# Patient Record
Sex: Male | Born: 1995 | Race: White | Hispanic: No | Marital: Married | Smoking: Current some day smoker
Health system: Southern US, Community
[De-identification: ages and names within clinical notes are randomized; demographics above are authoritative.]

## PROBLEM LIST (undated history)

## (undated) DIAGNOSIS — E669 Obesity, unspecified: Secondary | ICD-10-CM

## (undated) HISTORY — PX: HERNIA REPAIR: SHX51

---
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
## 2017-04-30 ENCOUNTER — Encounter: Payer: No Typology Code available for payment source | Admitting: Physician Assistant

## 2018-06-29 ENCOUNTER — Encounter: Payer: Self-pay | Admitting: Emergency Medicine

## 2018-06-29 ENCOUNTER — Ambulatory Visit: Payer: Self-pay | Admitting: Emergency Medicine

## 2018-06-29 VITALS — BP 116/70 | HR 95 | Temp 98.8°F | Resp 14

## 2018-06-29 DIAGNOSIS — B356 Tinea cruris: Secondary | ICD-10-CM

## 2018-06-29 DIAGNOSIS — R21 Rash and other nonspecific skin eruption: Secondary | ICD-10-CM

## 2018-06-29 LAB — POCT URINALYSIS DIPSTICK
Bilirubin, UA: NEGATIVE
Blood, UA: NEGATIVE
Glucose, UA: NEGATIVE
Ketones, UA: NEGATIVE
Leukocytes, UA: NEGATIVE
Nitrite, UA: NEGATIVE
Protein, UA: NEGATIVE
Spec Grav, UA: 1.02 (ref 1.010–1.025)
Urobilinogen, UA: 0.2 E.U./dL
pH, UA: 5.5 (ref 5.0–8.0)

## 2018-06-29 NOTE — Progress Notes (Signed)
Subjective: 5 days of worsening pruritic red rash on the skin of testicles and groin.  No swelling or pain in GU area.  The rash may have started after sweating a lot 5 days ago.   Patient started using OTC topical Lotrimin cream yesterday, and seems to help somewhat. He denies any other GU symptoms.  No dysuria or hematuria or urinary frequency or urethral discharge.  No high risk sexual behavior.  Denies fever or chills or nausea or vomiting.  Review of systems otherwise negative.  No fever or chills.  Objective: Pleasant male no distress.  Alert, cooperative. BP 116/70, pulse 92, temp 98.8, respirations 14, O2 saturation 98% on room air Lungs: Equal expansion Heart regular rate and rhythm Abdomen: Nondistended GU: Other than skin rash, no scrotal swelling or lesions.  Penis appears normal otherwise. Skin: Moist, diffusely reddened macular rash skin of scrotum, perineum, and inguinal area.  No other lesions.  No satellite lesions.  No pustules or vesicles. He consented to and requested UA today which is within normal limits.  Assessment: Tinea cruris  Plans:Treatment options discussed, as well as risks, benefits, alternatives. Patient voiced understanding and agreement with the following plans: Discussed keeping the area clean and dry. As he just started OTC Lotrimin yesterday, advised to continue applying Lotrimin cream twice daily for 7 to 14 days if needed.-He requested the option of written prescription for a "stronger" oral antifungal, and after risk benefits alternatives discussed, written prescription for Lamisil 250 mg.  #10, no refills.  1 p.o. daily for 7 to 10 days, if the Lotrimin topically not seeming to help.  Follow-up with your primary care doctor or dermatologist in 5-7 days if not improving, or sooner if symptoms become worse. Precautions discussed. Red flags discussed. Questions invited and answered. Patient voiced understanding and agreement.

## 2020-05-13 ENCOUNTER — Emergency Department: Payer: No Typology Code available for payment source

## 2020-05-13 ENCOUNTER — Emergency Department
Admission: EM | Admit: 2020-05-13 | Discharge: 2020-05-14 | Disposition: A | Discharge status: Home / Self Care | Payer: No Typology Code available for payment source | Attending: Emergency Medicine | Admitting: Emergency Medicine

## 2020-05-13 DIAGNOSIS — R220 Localized swelling, mass and lump, head: Secondary | ICD-10-CM | POA: Diagnosis present

## 2020-05-13 DIAGNOSIS — S60221A Contusion of right hand, initial encounter: Secondary | ICD-10-CM | POA: Diagnosis not present

## 2020-05-13 DIAGNOSIS — Y9389 Activity, other specified: Secondary | ICD-10-CM | POA: Diagnosis not present

## 2020-05-13 DIAGNOSIS — S022XXA Fracture of nasal bones, initial encounter for closed fracture: Secondary | ICD-10-CM | POA: Diagnosis not present

## 2020-05-13 DIAGNOSIS — Y999 Unspecified external cause status: Secondary | ICD-10-CM | POA: Insufficient documentation

## 2020-05-13 DIAGNOSIS — Y92149 Unspecified place in prison as the place of occurrence of the external cause: Secondary | ICD-10-CM | POA: Diagnosis not present

## 2020-05-13 DIAGNOSIS — F17200 Nicotine dependence, unspecified, uncomplicated: Secondary | ICD-10-CM | POA: Insufficient documentation

## 2020-05-13 MED ORDER — IBUPROFEN 600 MG PO TABS
600.0000 mg | ORAL_TABLET | Freq: Once | ORAL | Status: AC
  Administered 2020-05-13: 600 mg via ORAL
  Filled 2020-05-13: qty 1
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
## 2020-05-14 NOTE — Discharge Instructions (Signed)
As we discussed, your workup was notable for one or more nasal bone fractures.  These injuries are typically managed nonoperatively.  Please follow up with the physician listed or one of his/her colleagues in about a week (call at the next available opportunity to set up the appointment).  In the meantime, please use over-the-counter ibuprofen according to label instructions.  Do not blow your nose.  If you need to sneeze, try to keep your mouth open to decrease the pressure.  Return to the emergency department if you develop new or worsening symptoms that concern you.

## 2021-05-27 ENCOUNTER — Encounter: Payer: Self-pay | Admitting: Physician Assistant

## 2021-05-27 ENCOUNTER — Ambulatory Visit: Payer: Managed Care, Other (non HMO) | Admitting: Physician Assistant

## 2021-05-27 VITALS — BP 136/78 | HR 80 | Temp 98.1°F | Resp 16 | Ht 67.0 in | Wt 220.0 lb

## 2021-05-27 DIAGNOSIS — Z008 Encounter for other general examination: Secondary | ICD-10-CM | POA: Diagnosis not present

## 2021-05-27 DIAGNOSIS — Z0000 Encounter for general adult medical examination without abnormal findings: Secondary | ICD-10-CM
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
## 2021-05-27 NOTE — Patient Instructions (Signed)
Will report labs as available --please see MyChart report in a few days. Enjoyed meeting you !  Best wishes .Marland Kitchen Jan Fireman PA-C

## 2021-05-28 LAB — LIPID PANEL
Chol/HDL Ratio: 3.5 ratio (ref 0.0–5.0)
Cholesterol, Total: 186 mg/dL (ref 100–199)
HDL: 53 mg/dL (ref >39)
LDL Chol Calc (NIH): 105 mg/dL — ABNORMAL HIGH (ref 0–99)
Triglycerides: 158 mg/dL — ABNORMAL HIGH (ref 0–149)
VLDL Cholesterol Cal: 28 mg/dL (ref 5–40)

## 2021-05-28 LAB — GLUCOSE, RANDOM: Glucose: 80 mg/dL (ref 65–99)

## 2021-07-17 ENCOUNTER — Other Ambulatory Visit: Payer: Self-pay

## 2021-07-17 ENCOUNTER — Ambulatory Visit
Admission: RE | Admit: 2021-07-17 | Discharge: 2021-07-17 | Disposition: A | Discharge status: Home / Self Care | Payer: Managed Care, Other (non HMO) | Source: Ambulatory Visit | Attending: Emergency Medicine | Admitting: Emergency Medicine

## 2021-07-17 VITALS — BP 133/83 | HR 85 | Temp 98.4°F | Resp 18

## 2021-07-17 DIAGNOSIS — S61217A Laceration without foreign body of left little finger without damage to nail, initial encounter: Secondary | ICD-10-CM | POA: Diagnosis not present

## 2021-07-17 DIAGNOSIS — Z23 Encounter for immunization: Secondary | ICD-10-CM | POA: Diagnosis not present

## 2021-07-17 MED ORDER — TETANUS-DIPHTH-ACELL PERTUSSIS 5-2.5-18.5 LF-MCG/0.5 IM SUSY
0.5000 mL | PREFILLED_SYRINGE | Freq: Once | INTRAMUSCULAR | Status: AC
  Administered 2021-07-17: 0.5 mL via INTRAMUSCULAR

## 2021-07-17 MED ORDER — IBUPROFEN 600 MG PO TABS: 600.0000 mg | ORAL_TABLET | Freq: Four times a day (QID) | ORAL | 0 refills | Status: DC | PRN

## 2021-07-17 NOTE — ED Provider Notes (Signed)
HPI  SUBJECTIVE:  Ross Smith is a right-handed 25 y.o. male who presents with a laceration to his distal left little finger while cutting some raw chicken with a clean kitchen knife last night at 2330.  He states that he irrigated out extensively with soap and water, used alcohol to clean it.  He also tried liquid skin.  Came here because he is concerned he may have stitches.  He reports pain present only with use.  No erythema, edema, fevers.  No past medical history.  Tetanus unknown.  PMD: None.  History reviewed. No pertinent past medical history.  Past Surgical History:  Procedure Laterality Date   [place]    History reviewed. No pertinent family history.  Social History   Tobacco Use   Smoking status: Some Days    Types: Pipe, Cigars   Smokeless tobacco: Never   Tobacco comments:    Recently gave up viping- congratulated/ Now uses a few cigarettes and 1-2 cigars on days off-- works modified shifts with 2-3 days on /then similar off         Techniques to quit reviewed- encouraged- personal decision proceed  Vaping Use   Vaping Use: Never used  Substance Use Topics   Alcohol use: Yes    Alcohol/week: 12.0 standard drinks    Types: 6 Cans of beer, 6 Shots of liquor per week   Drug use: No    No current facility-administered medications for this encounter.  Current Outpatient Medications:    ibuprofen (ADVIL) 600 MG tablet, Take 1 tablet (600 mg total) by mouth every 6 (six) hours as needed., Disp: 30 tablet, Rfl: 0  No Known Allergies   ROS  As noted in HPI.   Physical Exam  BP 133/83 (BP Location: Left Arm)   Pulse 85   Temp 98.4 F (36.9 C) (Oral)   Resp 18   SpO2 98%   Constitutional: Well developed, well nourished, no acute distress Eyes:  EOMI, conjunctiva normal bilaterally HENT: Normocephalic, atraumatic,mucus membranes moist Respiratory: Normal inspiratory effort Cardiovascular: Normal rate GI: nondistended skin: No rash, skin  intact Musculoskeletal: 0.5 cm U-shaped laceration distal tip left little finger.  Wound explored with adequate hemostasis, with no obvious foreign body.  Two-point discrimination intact distally and over the flap.  No other evidence of injury to the hand.   Neurologic: Alert & oriented x 3, no focal neuro deficits Psychiatric: Speech and behavior appropriate   ED Course   Medications  Tdap (BOOSTRIX) injection 0.5 mL (0.5 mLs Intramuscular Given 07/17/21 1726)    No orders of the defined types were placed in this encounter.   No results found for this or any previous visit (from the past 24 hour(s)). No results found.  ED Clinical Impression  1. Laceration of left little finger without foreign body without damage to nail, initial encounter      ED Assessment/Plan  Laceration is deep enough to require sutures, however, there is no evidence that it extends down to the bone.  It is within the 18-hour window of repair.  Patient irrigated it out copiously last night.   had patient wash it out again with chlorhexidine soap and tap water.  Procedure note: Cleaned finger with alcohol.  Used 1.5 cc of 1% plain lidocaine for digital block with adequate anesthesia.  Placed 2 interrupted 5-0 Prolene sutures with close approximation of wound edges.  Bandage placed.  Tetanus updated.  Patient tolerated procedure well.  Do not think that  we need to send home with prophylactic antibiotics as he irrigated out extensively last night and again here.  There are no signs of infection.  Home with Tylenol and ibuprofen.  Return here in 10 days for suture removal, sooner for any signs of infection  Discussed  MDM, treatment plan, and plan for follow-up with patient. Discussed sn/sx that should prompt return to the ED. patient agrees with plan.   Meds ordered this encounter  Medications   Tdap (BOOSTRIX) injection 0.5 mL   ibuprofen (ADVIL) 600 MG tablet    Sig: Take 1 tablet (600 mg total) by mouth  every 6 (six) hours as needed.    Dispense:  30 tablet    Refill:  0      *This clinic note was created using Lobbyist. Therefore, there may be occasional mistakes despite careful proofreading.  ?    Melynda Ripple, MD 07/20/21 [phone]

## 2021-07-17 NOTE — Discharge Instructions (Addendum)
Keep this clean and dry for the next 3 to 4 days at least.  We have updated your tetanus today.  May take 600 mg of ibuprofen combined with 1000 mg of Tylenol together 3-4 times a day as needed for pain.  Return here in 10 days and we will take out the suture, return sooner for any signs of infection.

## 2021-07-17 NOTE — ED Triage Notes (Signed)
Pt presents for cut to left pinky while cooking last night.

## 2022-01-09 ENCOUNTER — Ambulatory Visit
Admission: RE | Admit: 2022-01-09 | Discharge: 2022-01-09 | Disposition: A | Discharge status: Home / Self Care | Payer: Managed Care, Other (non HMO) | Source: Ambulatory Visit | Attending: Emergency Medicine | Admitting: Emergency Medicine

## 2022-01-09 ENCOUNTER — Other Ambulatory Visit: Payer: Self-pay

## 2022-01-09 VITALS — BP 125/94 | HR 102 | Temp 98.4°F | Resp 19 | Ht 68.0 in | Wt 224.2 lb

## 2022-01-09 DIAGNOSIS — J069 Acute upper respiratory infection, unspecified: Secondary | ICD-10-CM | POA: Diagnosis present

## 2022-01-09 DIAGNOSIS — Z20822 Contact with and (suspected) exposure to covid-19: Secondary | ICD-10-CM | POA: Diagnosis present

## 2022-01-09 HISTORY — DX: Obesity, unspecified: E66.9

## 2022-01-09 LAB — RESP PANEL BY RT-PCR (FLU A&B, COVID) ARPGX2
Influenza A by PCR: NEGATIVE
Influenza B by PCR: NEGATIVE
SARS Coronavirus 2 by RT PCR: NEGATIVE

## 2022-01-09 LAB — GROUP A STREP BY PCR: Group A Strep by PCR: NOT DETECTED

## 2022-01-09 MED ORDER — FLUTICASONE PROPIONATE 50 MCG/ACT NA SUSP: 2.0000 | Freq: Every day | NASAL | 0 refills | Status: DC

## 2022-01-09 MED ORDER — IBUPROFEN 600 MG PO TABS: 600.0000 mg | ORAL_TABLET | Freq: Four times a day (QID) | ORAL | 0 refills | Status: DC | PRN

## 2022-01-09 NOTE — ED Triage Notes (Signed)
Pt is here with a cough and sore throat since Monday, pt has taken OTC meds to relieve discomfort. ?

## 2022-01-09 NOTE — Discharge Instructions (Addendum)
We will contact you if and only if your labs are positive.  I will call in the appropriate medication based on your lab results.  Please answer phone calls from unknown phone numbers tonight.  You also get the results in my chart.  1 gram of Tylenol and 600 mg ibuprofen together 3-4 times a day as needed for pain.  Make sure you drink plenty of extra fluids.  Some people find salt water gargles and  Traditional Medicinal's "Throat Coat" tea helpful. Take 5 mL of liquid Benadryl and 5 mL of Maalox. Mix it together, and then hold it in your mouth for as long as you can and then swallow. You may do this 4 times a day.  Continue DayQuil, Echinacea, multivitamin.  Start Flonase, saline nasal irrigation with a NeilMed sinus rinse and distilled water as often as you want.  This will help prevent a bacterial sinus infection. ? ?Follow-up with a primary care provider of your choice.  See list below. ? ?Go to www.goodrx.com  or www.costplusdrugs.com to look up your medications. This will give you a list of where you can find your prescriptions at the most affordable prices. Or ask the pharmacist what the cash price is, or if they have any other discount programs available to help make your medication more affordable. This can be less expensive than what you would pay with insurance.   ? ?Here is a list of primary care providers who are taking new patients: ? ?[facility] [place] ?Dr. Rosette Reveal (sports medicine) ?Dr. Otilio Miu ?[address] ?[place] [address] ?[phone] ? ?[facility] ?[address], [place] [address] ?[phone] ? ?[facility] [place] ?[place] [address]  ?[phone] ? ?[facility] ?[place], [place] [address] ?[phone] ? ?[facility] ?[place]  ?[phone] ?[place], [place] [address] ? ?Here are clinics/ other resources who will see you if you do not have insurance. Some have certain  criteria that you must meet. Call them and find out what they are: ? ?[facility]: ?[address], [place], [place] [address] ?Phone: [phone] ?Hours: First and Third Saturdays of each Month, 9 a.m. - 1 p.m. ? ?[facility]: ?[address], [place], [place], [place] [address] ?Phone: [phone] ?Hours: ?Tuesday, 4 p.m. - 8 p.m. ?Thursday, 1 p.m. - 8 p.m. ?Wednesday, 9 a.m. - Noon ? ?[facility] ?[address] [place], [place] [address] ?Phone: [phone] ?Pharmacy Phone Number: [phone] ?Dental Phone Number: [phone] ?[employer] Insurance Help: [phone] ? ?Dental Hours: ?Monday - Thursday, 8 a.m. - 6 p.m. ? ?[place] ?[address] [place], [place] [address] ?Phone: [phone] ?Pharmacy Phone Number: [phone] ?[employer] Insurance Help: [phone] ? ?[facility] ?[address] [place], [place] [address] ?Phone: [phone] ?Pharmacy Phone Number: [phone] ?[employer] Insurance Help: [phone] ? ?[facility] ?[place],  [address] ?Phone: [phone] ?[employer] Help: [phone]  ? ?[address] [place],  [address] ?Phone: [phone] ?  ?

## 2022-01-09 NOTE — ED Provider Notes (Signed)
HPI ? ?SUBJECTIVE: ? ?Ross Smith is a 26 y.o. male who presents with 4 days of sore throat, fatigue, dry cough, body aches, nasal congestion, sinus pressure, postnasal drip.  He reports 1 episode of emesis, but is tolerating p.o. today.  No fevers, headaches, rhinorrhea, loss of sense of smell or taste, wheezing, shortness of breath, dyspnea on exertion, nausea, diarrhea, abdominal pain, rash.  No drooling, trismus, voice changes, neck stiffness, sensation of throat swelling shut, difficulty breathing or swallowing.  No allergy or GERD symptoms.  No known COVID, flu, strep, mono exposure.  He did not get the COVID or flu vaccines.  No antibiotics in the past month.  No antipyretic in the past 6 hours.  He has tried DayQuil, multivitamins, Echinacea with improvement in his symptoms.  No aggravating factors.  He is able to sleep at night without waking up coughing.  He has a past medical history of BMI above 30.  PCP: None. ? ?Past Medical History:  ?Diagnosis Date  ? Obese   ? ? ?Past Surgical History:  ?Procedure Laterality Date  ? HERNIA REPAIR  1998  ? ? ?Family History  ?Problem Relation Age of Onset  ? Healthy Mother   ? Healthy Father   ? ? ?Social History  ? ?Tobacco Use  ? Smoking status: Some Days  ?  Types: Pipe, Cigars  ? Smokeless tobacco: Never  ? Tobacco comments:  ?  Recently gave up viping- congratulated/ Now uses a few cigarettes and 1-2 cigars on days off-- works modified shifts with 2-3 days on /then similar off         Techniques to quit reviewed- encouraged- personal decision proceed  ?Vaping Use  ? Vaping Use: Never used  ?Substance Use Topics  ? Alcohol use: Yes  ?  Alcohol/week: 12.0 standard drinks  ?  Types: 6 Cans of beer, 6 Shots of liquor per week  ? Drug use: No  ? ? ?No current facility-administered medications for this encounter. ? ?Current Outpatient Medications:  ?  fluticasone (FLONASE) 50 MCG/ACT nasal spray, Place 2 sprays into both nostrils daily., Disp: 16 g, Rfl: 0 ?   ibuprofen (ADVIL) 600 MG tablet, Take 1 tablet (600 mg total) by mouth every 6 (six) hours as needed., Disp: 30 tablet, Rfl: 0 ? ?No Known Allergies ? ? ?ROS ? ?As noted in HPI.  ? ?Physical Exam ? ?BP (!) 125/94 (BP Location: Right Arm)   Pulse (!) 102   Temp 98.4 ?F (36.9 ?C) (Oral)   Resp 19   Ht '5\' 8"'$  (1.727 m)   Wt 101.7 kg   SpO2 96%   BMI 34.09 kg/m?  ? ?Constitutional: Well developed, well nourished, no acute distress ?Eyes:  EOMI, conjunctiva normal bilaterally ?HENT: Normocephalic, atraumatic,mucus membranes moist.  Positive nasal congestion.  Normal turbinates.  No maxillary, frontal sinus tenderness.  Beefy red oropharynx.  Tonsils normal size without exudates.  Uvula midline.  Positive postnasal drip. ?Neck: Positive anterior, posterior cervical lymphadenopathy. ?Respiratory: Normal inspiratory effort, lungs clear bilaterally.  Anterior, lateral chest ?Cardiovascular: Borderline regular tachycardia, no murmurs ?GI: nondistended, soft, nontender, no appreciable splenomegaly ?skin: No rash, skin intact ?Musculoskeletal: no deformities ?Neurologic: Alert & oriented x 3, no focal neuro deficits ?Psychiatric: Speech and behavior appropriate ? ? ?ED Course ? ? ?Medications - No data to display ? ?Orders Placed This Encounter  ?Procedures  ? Group A Strep by PCR  ?  Standing Status:   Standing  ?  Number of Occurrences:  1  ?  Order Specific Question:   Patient immune status  ?  Answer:   Normal  ?  Order Specific Question:   Release to patient  ?  Answer:   Immediate  ? Resp Panel by RT-PCR (Flu A&B, Covid) Nasopharyngeal Swab  ?  Standing Status:   Standing  ?  Number of Occurrences:   1  ? Airborne and Contact precautions  ?  Standing Status:   Standing  ?  Number of Occurrences:   1  ? ? ?Results for orders placed or performed during the hospital encounter of 01/09/22 (from the past 24 hour(s))  ?Resp Panel by RT-PCR (Flu A&B, Covid) Nasopharyngeal Swab     Status: None  ? Collection Time: 01/09/22   6:49 PM  ? Specimen: Nasopharyngeal Swab; Nasopharyngeal(NP) swabs in vial transport medium  ?Result Value Ref Range  ? SARS Coronavirus 2 by RT PCR NEGATIVE NEGATIVE  ? Influenza A by PCR NEGATIVE NEGATIVE  ? Influenza B by PCR NEGATIVE NEGATIVE  ? ?No results found. ? ?ED Clinical Impression ? ?1. Upper respiratory tract infection, unspecified type   ?2. Encounter for laboratory testing for COVID-19 virus   ?  ? ?ED Assessment/Plan ? ? ?Strep, COVID, flu PCR negative.  Patient with a URI.  Home with ibuprofen 600 mg, saline nasal irrigation, Flonase, Benadryl/Maalox mixture.  He will continue DayQuil, Echinacea, multivitamin.  Work note for Friday and Saturday off.  ER return precautions given.  Will provide primary care list and order assistance in finding a PCP. ? ?Discussed labs. MDM, treatment plan, and plan for follow-up with patient. Discussed sn/sx that should prompt return to the ED. patient agrees with plan.  ? ?Meds ordered this encounter  ?Medications  ? fluticasone (FLONASE) 50 MCG/ACT nasal spray  ?  Sig: Place 2 sprays into both nostrils daily.  ?  Dispense:  16 g  ?  Refill:  0  ? ibuprofen (ADVIL) 600 MG tablet  ?  Sig: Take 1 tablet (600 mg total) by mouth every 6 (six) hours as needed.  ?  Dispense:  30 tablet  ?  Refill:  0  ? ? ? ? ?*This clinic note was created using Lobbyist. Therefore, there may be occasional mistakes despite careful proofreading. ? ?? ? ?  ?Melynda Ripple, MD ?01/09/22 1935 ? ?
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
## 2022-12-29 ENCOUNTER — Encounter: Payer: Self-pay | Admitting: Podiatry

## 2022-12-29 ENCOUNTER — Ambulatory Visit (INDEPENDENT_AMBULATORY_CARE_PROVIDER_SITE_OTHER): Payer: Managed Care, Other (non HMO)

## 2022-12-29 ENCOUNTER — Ambulatory Visit: Payer: Managed Care, Other (non HMO) | Admitting: Podiatry

## 2022-12-29 DIAGNOSIS — M7750 Other enthesopathy of unspecified foot: Secondary | ICD-10-CM

## 2022-12-29 DIAGNOSIS — M722 Plantar fascial fibromatosis: Secondary | ICD-10-CM | POA: Diagnosis not present

## 2022-12-29 DIAGNOSIS — M7752 Other enthesopathy of left foot: Secondary | ICD-10-CM

## 2022-12-29 DIAGNOSIS — M62462 Contracture of muscle, left lower leg: Secondary | ICD-10-CM | POA: Diagnosis not present

## 2022-12-29 DIAGNOSIS — M62461 Contracture of muscle, right lower leg: Secondary | ICD-10-CM | POA: Diagnosis not present

## 2022-12-29 MED ORDER — MELOXICAM 15 MG PO TABS: 15.0000 mg | ORAL_TABLET | Freq: Every day | ORAL | 3 refills | Status: DC

## 2022-12-29 NOTE — Patient Instructions (Signed)
If you are not about 50% better in 1 month please let me know and I will send you to a physical therapist to work on this as well    Plantar Fasciitis (Heel Spur Syndrome) with Rehab The plantar fascia is a fibrous, ligament-like, soft-tissue structure that spans the bottom of the foot. Plantar fasciitis is a condition that causes pain in the foot due to inflammation of the tissue. SYMPTOMS  Pain and tenderness on the underneath side of the foot. Pain that worsens with standing or walking. CAUSES  Plantar fasciitis is caused by irritation and injury to the plantar fascia on the underneath side of the foot. Common mechanisms of injury include: Direct trauma to bottom of the foot. Damage to a small nerve that runs under the foot where the main fascia attaches to the heel bone. Stress placed on the plantar fascia due to bone spurs. RISK INCREASES WITH:  Activities that place stress on the plantar fascia (running, jumping, pivoting, or cutting). Poor strength and flexibility. Improperly fitted shoes. Tight calf muscles. Flat feet. Failure to warm-up properly before activity. Obesity. PREVENTION Warm up and stretch properly before activity. Allow for adequate recovery between workouts. Maintain physical fitness: Strength, flexibility, and endurance. Cardiovascular fitness. Maintain a health body weight. Avoid stress on the plantar fascia. Wear properly fitted shoes, including arch supports for individuals who have flat feet.  PROGNOSIS  If treated properly, then the symptoms of plantar fasciitis usually resolve without surgery. However, occasionally surgery is necessary.  RELATED COMPLICATIONS  Recurrent symptoms that may result in a chronic condition. Problems of the lower back that are caused by compensating for the injury, such as limping. Pain or weakness of the foot during push-off following surgery. Chronic inflammation, scarring, and partial or complete fascia tear, occurring  more often from repeated injections.  TREATMENT  Treatment initially involves the use of ice and medication to help reduce pain and inflammation. The use of strengthening and stretching exercises may help reduce pain with activity, especially stretches of the Achilles tendon. These exercises may be performed at home or with a therapist. Your caregiver may recommend that you use heel cups of arch supports to help reduce stress on the plantar fascia. Occasionally, corticosteroid injections are given to reduce inflammation. If symptoms persist for greater than 6 months despite non-surgical (conservative), then surgery may be recommended.   MEDICATION  If pain medication is necessary, then nonsteroidal anti-inflammatory medications, such as aspirin and ibuprofen, or other minor pain relievers, such as acetaminophen, are often recommended. Do not take pain medication within 7 days before surgery. Prescription pain relievers may be given if deemed necessary by your caregiver. Use only as directed and only as much as you need. Corticosteroid injections may be given by your caregiver. These injections should be reserved for the most serious cases, because they may only be given a certain number of times.  HEAT AND COLD Cold treatment (icing) relieves pain and reduces inflammation. Cold treatment should be applied for 10 to 15 minutes every 2 to 3 hours for inflammation and pain and immediately after any activity that aggravates your symptoms. Use ice packs or massage the area with a piece of ice (ice massage). Heat treatment may be used prior to performing the stretching and strengthening activities prescribed by your caregiver, physical therapist, or athletic trainer. Use a heat pack or soak the injury in warm water.  SEEK IMMEDIATE MEDICAL CARE IF: Treatment seems to offer no benefit, or the condition worsens. Any medications  produce adverse side effects.  EXERCISES- RANGE OF MOTION (ROM) AND  STRETCHING EXERCISES - Plantar Fasciitis (Heel Spur Syndrome) These exercises may help you when beginning to rehabilitate your injury. Your symptoms may resolve with or without further involvement from your physician, physical therapist or athletic trainer. While completing these exercises, remember:  Restoring tissue flexibility helps normal motion to return to the joints. This allows healthier, less painful movement and activity. An effective stretch should be held for at least 30 seconds. A stretch should never be painful. You should only feel a gentle lengthening or release in the stretched tissue.  RANGE OF MOTION - Toe Extension, Flexion Sit with your right / left leg crossed over your opposite knee. Grasp your toes and gently pull them back toward the top of your foot. You should feel a stretch on the bottom of your toes and/or foot. Hold this stretch for 10 seconds. Now, gently pull your toes toward the bottom of your foot. You should feel a stretch on the top of your toes and or foot. Hold this stretch for 10 seconds. Repeat  times. Complete this stretch 3 times per day.   RANGE OF MOTION - Ankle Dorsiflexion, Active Assisted Remove shoes and sit on a chair that is preferably not on a carpeted surface. Place right / left foot under knee. Extend your opposite leg for support. Keeping your heel down, slide your right / left foot back toward the chair until you feel a stretch at your ankle or calf. If you do not feel a stretch, slide your bottom forward to the edge of the chair, while still keeping your heel down. Hold this stretch for 10 seconds. Repeat 3 times. Complete this stretch 2 times per day.   STRETCH  Gastroc, Standing Place hands on wall. Extend right / left leg, keeping the front knee somewhat bent. Slightly point your toes inward on your back foot. Keeping your right / left heel on the floor and your knee straight, shift your weight toward the wall, not allowing your back  to arch. You should feel a gentle stretch in the right / left calf. Hold this position for 10 seconds. Repeat 3 times. Complete this stretch 2 times per day.  STRETCH  Soleus, Standing Place hands on wall. Extend right / left leg, keeping the other knee somewhat bent. Slightly point your toes inward on your back foot. Keep your right / left heel on the floor, bend your back knee, and slightly shift your weight over the back leg so that you feel a gentle stretch deep in your back calf. Hold this position for 10 seconds. Repeat 3 times. Complete this stretch 2 times per day.  STRETCH  Gastrocsoleus, Standing  Note: This exercise can place a lot of stress on your foot and ankle. Please complete this exercise only if specifically instructed by your caregiver.  Place the ball of your right / left foot on a step, keeping your other foot firmly on the same step. Hold on to the wall or a rail for balance. Slowly lift your other foot, allowing your body weight to press your heel down over the edge of the step. You should feel a stretch in your right / left calf. Hold this position for 10 seconds. Repeat this exercise with a slight bend in your right / left knee. Repeat 3 times. Complete this stretch 2 times per day.   STRENGTHENING EXERCISES - Plantar Fasciitis (Heel Spur Syndrome)  These exercises may help you  when beginning to rehabilitate your injury. They may resolve your symptoms with or without further involvement from your physician, physical therapist or athletic trainer. While completing these exercises, remember:  Muscles can gain both the endurance and the strength needed for everyday activities through controlled exercises. Complete these exercises as instructed by your physician, physical therapist or athletic trainer. Progress the resistance and repetitions only as guided.  STRENGTH - Towel Curls Sit in a chair positioned on a non-carpeted surface. Place your foot on a towel,  keeping your heel on the floor. Pull the towel toward your heel by only curling your toes. Keep your heel on the floor. Repeat 3 times. Complete this exercise 2 times per day.  STRENGTH - Ankle Inversion Secure one end of a rubber exercise band/tubing to a fixed object (table, pole). Loop the other end around your foot just before your toes. Place your fists between your knees. This will focus your strengthening at your ankle. Slowly, pull your big toe up and in, making sure the band/tubing is positioned to resist the entire motion. Hold this position for 10 seconds. Have your muscles resist the band/tubing as it slowly pulls your foot back to the starting position. Repeat 3 times. Complete this exercises 2 times per day.  Document Released: 09/15/2005 Document Revised: 12/08/2011 Document Reviewed: 12/28/2008 [facility] Patient Information 2014 [place], [place].

## 2022-12-29 NOTE — Progress Notes (Signed)
  Subjective:  Patient ID: Ross Smith, male    DOB: 1996-08-21,  MRN: [id number]  Chief Complaint  Patient presents with   Foot Pain    Chronic pain in left foot towards the end of the day and during prolonged physical activity. Estimated to have gone on for nearly a year    27 y.o. male presents with the above complaint. History confirmed with patient.   Objective:  Physical Exam: warm, good capillary refill, no trophic changes or ulcerative lesions, normal DP and PT pulses, and normal sensory exam. Left Foot: point tenderness over the heel pad and gastrocnemius equinus is noted with a positive silverskiold test    Radiographs: Multiple views x-ray of the left foot: no fracture, dislocation, swelling or degenerative changes noted and posterior calcaneal spur Assessment:   1. Plantar fasciitis, left   2. Gastrocnemius equinus of left lower extremity   3. Gastrocnemius equinus of right lower extremity      Plan:  Patient was evaluated and treated and all questions answered.  Discussed the etiology and treatment options for plantar fasciitis including stretching, formal physical therapy, supportive shoegears such as a running shoe or sneaker, pre fabricated orthoses, injection therapy, and oral medications. We also discussed the role of surgical treatment of this for patients who do not improve after exhausting non-surgical treatment options.   -XR reviewed with patient -Educated patient on stretching and icing of the affected limb -Injection delivered to the plantar fascia of the left foot. -Rx for meloxicam. Educated on use, risks and benefits of the medication   Return if symptoms worsen or fail to improve.

## 2023-04-23 ENCOUNTER — Ambulatory Visit
Admission: RE | Admit: 2023-04-23 | Discharge: 2023-04-23 | Disposition: A | Discharge status: Home / Self Care | Payer: Managed Care, Other (non HMO) | Source: Ambulatory Visit | Attending: Emergency Medicine | Admitting: Emergency Medicine

## 2023-04-23 ENCOUNTER — Ambulatory Visit: Payer: Managed Care, Other (non HMO)

## 2023-04-23 VITALS — BP 135/79 | HR 108 | Temp 98.3°F | Resp 16

## 2023-04-23 DIAGNOSIS — S91331A Puncture wound without foreign body, right foot, initial encounter: Secondary | ICD-10-CM

## 2023-04-23 MED ORDER — IBUPROFEN 600 MG PO TABS: 600.0000 mg | ORAL_TABLET | Freq: Four times a day (QID) | ORAL | 0 refills | Status: AC | PRN

## 2023-04-23 MED ORDER — CIPROFLOXACIN HCL 750 MG PO TABS: 750.0000 mg | ORAL_TABLET | Freq: Two times a day (BID) | ORAL | 0 refills | Status: AC

## 2023-04-23 MED ORDER — CEPHALEXIN 500 MG PO CAPS: 500.0000 mg | ORAL_CAPSULE | Freq: Four times a day (QID) | ORAL | 0 refills | Status: AC

## 2023-04-23 NOTE — ED Triage Notes (Signed)
Pt stepped on a nail last night with right foot, now having pain and slight swelling. Pt states last tenus shot was 2022.

## 2023-04-23 NOTE — Discharge Instructions (Addendum)
There is no foreign body seen on your x-ray.  Finish the antibiotics, even if you feel better.  You may take 600 mg of ibuprofen with 1000 mg of Tylenol together 3 times a day as needed for pain.  Keep this clean with soap and water.  I have put in an urgent referral to podiatry.  Try to make an appointment with podiatry, but they do not have any appointments tomorrow.  Call and make an appointment on Monday.  You can always cancel it.  Follow-up with podiatry, [facility], here or with your PCP in 24 to 36 hours to make sure things are not getting worse.  Go to the ER if things start to get worse, you have fevers above 100.4, or for other concerns.

## 2023-04-23 NOTE — ED Provider Notes (Signed)
HPI  SUBJECTIVE:  Ross Smith is a 27 y.o. male who presents with plantar right foot pain that is sharp with walking that lasts for a long time after he finishes walking, erythema after stepping on a nail last night that went through his shoe.  He reports some bleeding.  He has not noticed any erythema, swelling, purulent drainage, fevers, body aches, foreign body sensation, numbness or tingling in his toes, limited motion of his toes.  He rinsed it out with water and applied Neosporin without improvement in his symptoms.  Symptoms worse with walking.  He has not taken antipyretic in the past 6 hours.  He smokes cigars, pipe.  No history of PAD/PVD, diabetes, peripheral neuropathy.  Tetanus in 2022.  PCP: [facility]    Past Medical History:  Diagnosis Date   Obese     Past Surgical History:  Procedure Laterality Date   HERNIA REPAIR  1998    Family History  Problem Relation Age of Onset   Healthy Mother    Healthy Father     Social History   Tobacco Use   Smoking status: Some Days    Types: Pipe, Cigars   Smokeless tobacco: Never   Tobacco comments:    Recently gave up viping- congratulated/ Now uses a few cigarettes and 1-2 cigars on days off-- works modified shifts with 2-3 days on /then similar off         Techniques to quit reviewed- encouraged- personal decision proceed  Vaping Use   Vaping status: Never Used  Substance Use Topics   Alcohol use: Yes    Alcohol/week: 12.0 standard drinks of alcohol    Types: 6 Cans of beer, 6 Shots of liquor per week   Drug use: No    No current facility-administered medications for this encounter.  Current Outpatient Medications:    cephALEXin (KEFLEX) 500 MG capsule, Take 1 capsule (500 mg total) by mouth 4 (four) times daily for 5 days., Disp: 20 capsule, Rfl: 0   ciprofloxacin (CIPRO) 750 MG tablet, Take 1 tablet (750 mg total) by mouth 2 (two) times daily for 5 days., Disp: 10 tablet, Rfl: 0   ibuprofen (ADVIL) 600 MG  tablet, Take 1 tablet (600 mg total) by mouth every 6 (six) hours as needed., Disp: 30 tablet, Rfl: 0  No Known Allergies   ROS  As noted in HPI.   Physical Exam  BP 135/79 (BP Location: Left Arm)   Pulse (!) 108   Temp 98.3 F (36.8 C) (Oral)   Resp 16   SpO2 98%   Constitutional: Well developed, well nourished, no acute distress Eyes:  EOMI, conjunctiva normal bilaterally HENT: Normocephalic, atraumatic,mucus membranes moist Respiratory: Normal inspiratory effort Cardiovascular: Normal rate GI: nondistended skin: See MSK exam Musculoskeletal: Right foot: 2.5 x 2 cm tender area of erythema, no induration.  Positive puncture wound sole.  No expressible purulent drainage.  No pain with moving toes or ankle.  Marked area of erythema with a marker for reference  Neurologic: Alert & oriented x 3, no focal neuro deficits Psychiatric: Speech and behavior appropriate   ED Course   Medications - No data to display  Orders Placed This Encounter  Procedures   DG Foot Complete Right    Standing Status:   Standing    Number of Occurrences:   1    Order Specific Question:   Reason for Exam (SYMPTOM  OR DIAGNOSIS REQUIRED)    Answer:   Stepped on nail,  rule out retained foreign body   Ambulatory referral to Podiatry    Referral Priority:   Urgent    Referral Type:   Consultation    Referral Reason:   Specialty Services Required    Requested Specialty:   Podiatry    Number of Visits Requested:   1    No results found for this or any previous visit (from the past 24 hour(s)). DG Foot Complete Right  Result Date: 04/23/2023 CLINICAL DATA:  Stepped on nail.  Rule out foreign body. EXAM: RIGHT FOOT COMPLETE - 3+ VIEW COMPARISON:  None Available. FINDINGS: There is no evidence of fracture or dislocation. There is no evidence of arthropathy or other focal bone abnormality. Soft tissue swelling about the dorsum and plantar aspect of the foot. No radiopaque foreign body. IMPRESSION:  Soft tissue swelling about the dorsum and plantar aspect of the foot. No radiopaque foreign body. Electronically Signed   By: Larose Hires D.O.   On: 04/23/2023 16:22    ED Clinical Impression  1. Puncture wound of right foot, initial encounter      ED Assessment/Plan     Reviewed imaging independently.  Soft tissue swelling about the dorsum and plantar aspect of the foot.  No foreign body, subcutaneous gas. seen see radiology report for full details.  Scrub wound with chlorhexidine diluted in tap water.  Irrigated wound out with 60 cc of chlorhexidine/tap water.  Then irrigated wound out with 60 cc of plain tap water.  Applied Band-Aid.  Patient tolerated this well.  Patient presents with a puncture wound to the sole of the foot.  I am concerned that it is infected.  There is no pain with passive movement, crepitus, purulent drainage, patient not appear to be systemically ill.  there does not appear to be an abscess, tenosynovitis, septic arthritis or osteomyelitis at this time.  He is otherwise healthy, he has no history of diabetes, PAD/PVD or neuropathy.  Home with Keflex of 500 mg 4 times daily for 5 days plus Cipro 750 mg every 12 for 5 days per up-to-date recommendations as I am concerned about Pseudomonas because the nail went through the sole of the shoe.  He needs to follow-up here or with his PCP, with podiatry in 24 to 36 hours.  Will put in urgent referral to podiatry.  he will go to the ER if he gets worse.  I attempted to make an appointment for the patient tomorrow, Friday, with [facility] in [place].  They do not have anything available.  Will have him follow-up with [facility].  Discussed  imaging, MDM, treatment plan, and plan for follow-up with patient. Discussed sn/sx that should prompt return to the ED. patient agrees with plan.   Meds ordered this encounter  Medications   ciprofloxacin (CIPRO) 750 MG tablet    Sig: Take 1 tablet (750 mg total) by mouth 2  (two) times daily for 5 days.    Dispense:  10 tablet    Refill:  0   cephALEXin (KEFLEX) 500 MG capsule    Sig: Take 1 capsule (500 mg total) by mouth 4 (four) times daily for 5 days.    Dispense:  20 capsule    Refill:  0   ibuprofen (ADVIL) 600 MG tablet    Sig: Take 1 tablet (600 mg total) by mouth every 6 (six) hours as needed.    Dispense:  30 tablet    Refill:  0      *This  clinic note was created using Scientist, clinical (histocompatibility and immunogenetics). Therefore, there may be occasional mistakes despite careful proofreading.  ?    Domenick Gong, MD 04/25/23 [phone]

## 2023-04-28 ENCOUNTER — Ambulatory Visit: Payer: Managed Care, Other (non HMO) | Admitting: Podiatry

## 2024-03-26 ENCOUNTER — Ambulatory Visit
